# Patient Record
Sex: Female | Born: 1960 | Race: White | Hispanic: No | Marital: Married | Smoking: Never smoker
Health system: Southern US, Community
[De-identification: ages and names within clinical notes are randomized; demographics above are authoritative.]

## PROBLEM LIST (undated history)

## (undated) DIAGNOSIS — C4491 Basal cell carcinoma of skin, unspecified: Secondary | ICD-10-CM

## (undated) DIAGNOSIS — S52501A Unspecified fracture of the lower end of right radius, initial encounter for closed fracture: Secondary | ICD-10-CM

## (undated) DIAGNOSIS — K219 Gastro-esophageal reflux disease without esophagitis: Secondary | ICD-10-CM

## (undated) HISTORY — DX: Basal cell carcinoma of skin, unspecified: C44.91

---

## 1999-06-22 ENCOUNTER — Other Ambulatory Visit: Admission: RE | Admit: 1999-06-22 | Discharge: 1999-06-22 | Payer: Self-pay | Admitting: *Deleted

## 2000-10-24 ENCOUNTER — Other Ambulatory Visit: Admission: RE | Admit: 2000-10-24 | Discharge: 2000-10-24 | Payer: Self-pay | Admitting: Gynecology

## 2000-11-08 ENCOUNTER — Encounter (INDEPENDENT_AMBULATORY_CARE_PROVIDER_SITE_OTHER): Payer: Self-pay

## 2000-11-08 ENCOUNTER — Other Ambulatory Visit: Admission: RE | Admit: 2000-11-08 | Discharge: 2000-11-08 | Payer: Self-pay | Admitting: Gynecology

## 2001-11-09 ENCOUNTER — Other Ambulatory Visit: Admission: RE | Admit: 2001-11-09 | Discharge: 2001-11-09 | Payer: Self-pay | Admitting: Gynecology

## 2003-03-12 ENCOUNTER — Other Ambulatory Visit: Admission: RE | Admit: 2003-03-12 | Discharge: 2003-03-12 | Payer: Self-pay | Admitting: Obstetrics and Gynecology

## 2004-01-07 ENCOUNTER — Emergency Department (HOSPITAL_COMMUNITY): Admission: AD | Admit: 2004-01-07 | Discharge: 2004-01-07 | Payer: Self-pay | Admitting: Family Medicine

## 2004-02-15 ENCOUNTER — Emergency Department (HOSPITAL_COMMUNITY): Admission: AD | Admit: 2004-02-15 | Discharge: 2004-02-15 | Payer: Self-pay | Admitting: Family Medicine

## 2004-04-13 ENCOUNTER — Other Ambulatory Visit: Admission: RE | Admit: 2004-04-13 | Discharge: 2004-04-13 | Payer: Self-pay | Admitting: Obstetrics and Gynecology

## 2005-08-23 ENCOUNTER — Other Ambulatory Visit: Admission: RE | Admit: 2005-08-23 | Discharge: 2005-08-23 | Payer: Self-pay | Admitting: Obstetrics and Gynecology

## 2006-09-20 ENCOUNTER — Emergency Department (HOSPITAL_COMMUNITY): Admission: EM | Admit: 2006-09-20 | Discharge: 2006-09-20 | Payer: Self-pay | Admitting: Family Medicine

## 2006-11-17 ENCOUNTER — Other Ambulatory Visit: Admission: RE | Admit: 2006-11-17 | Discharge: 2006-11-17 | Payer: Self-pay | Admitting: Obstetrics and Gynecology

## 2008-06-01 ENCOUNTER — Emergency Department (HOSPITAL_COMMUNITY): Admission: EM | Admit: 2008-06-01 | Discharge: 2008-06-01 | Payer: Self-pay | Admitting: Family Medicine

## 2008-06-28 ENCOUNTER — Emergency Department (HOSPITAL_COMMUNITY): Admission: EM | Admit: 2008-06-28 | Discharge: 2008-06-28 | Payer: Self-pay | Admitting: Emergency Medicine

## 2008-12-03 ENCOUNTER — Other Ambulatory Visit: Admission: RE | Admit: 2008-12-03 | Discharge: 2008-12-03 | Payer: Self-pay | Admitting: Obstetrics and Gynecology

## 2010-06-23 LAB — HM PAP SMEAR: HM Pap smear: NEGATIVE

## 2010-09-20 ENCOUNTER — Emergency Department (HOSPITAL_COMMUNITY): Admission: EM | Admit: 2010-09-20 | Discharge: 2010-09-20 | Payer: Self-pay | Admitting: Emergency Medicine

## 2012-01-03 ENCOUNTER — Encounter (HOSPITAL_COMMUNITY): Payer: Self-pay | Admitting: Emergency Medicine

## 2012-01-03 ENCOUNTER — Emergency Department (HOSPITAL_COMMUNITY)
Admission: EM | Admit: 2012-01-03 | Discharge: 2012-01-03 | Disposition: A | Discharge status: Home / Self Care | Payer: 59 | Source: Home / Self Care | Attending: Family Medicine | Admitting: Family Medicine

## 2012-01-03 DIAGNOSIS — J329 Chronic sinusitis, unspecified: Secondary | ICD-10-CM

## 2012-01-03 MED ORDER — FEXOFENADINE-PSEUDOEPHED ER 60-120 MG PO TB12: 1.0000 | ORAL_TABLET | Freq: Two times a day (BID) | ORAL | Status: AC

## 2012-01-03 MED ORDER — AMOXICILLIN 500 MG PO CAPS: 500.0000 mg | ORAL_CAPSULE | Freq: Three times a day (TID) | ORAL | Status: AC

## 2012-01-03 MED ORDER — PREDNISONE 20 MG PO TABS: ORAL_TABLET | ORAL | Status: AC

## 2012-01-03 MED ORDER — IBUPROFEN 600 MG PO TABS: 600.0000 mg | ORAL_TABLET | Freq: Three times a day (TID) | ORAL | Status: AC | PRN

## 2012-01-03 NOTE — ED Notes (Signed)
Pt. Stated, I think I have a sinus infection , pain in face with body aches a little fever and headache.  Onset one week

## 2012-01-04 NOTE — ED Provider Notes (Signed)
History     CSN: [id number]  Arrival date & time 01/03/12  1929   First MD Initiated Contact with Patient 01/03/12 1940      Chief Complaint  Patient presents with  . Facial Pain    (Consider location/radiation/quality/duration/timing/severity/associated sxs/prior treatment) HPI Comments: 51 y/o female no significant PMH here c/o sinus pain, nasal congestion, yellow rhinorrhea and body aches for 1 week. Subjective fever. Denies cough shortness of breath or chest pain. Had mild sore throat initially but now resolved. Sinus pain with headache worsening.    History reviewed. No pertinent past medical history.  No past surgical history on file.  No family history on file.  History  Substance Use Topics  . Smoking status: Former Games developer  . Smokeless tobacco: Not on file  . Alcohol Use: 0.6 oz/week    1 Glasses of wine per week     once a month    OB History    Grav Para Term Preterm Abortions TAB SAB Ect Mult Living                  Review of Systems  Constitutional: Positive for chills.  HENT: Positive for congestion, rhinorrhea and sinus pressure. Negative for ear pain, sore throat, trouble swallowing, neck pain and voice change.   Respiratory: Negative for cough, chest tightness, shortness of breath and wheezing.   Cardiovascular: Negative for chest pain and leg swelling.  Gastrointestinal: Positive for abdominal pain. Negative for nausea and vomiting.  Musculoskeletal: Positive for myalgias.  Skin: Negative for rash.  Neurological: Positive for headaches.    Allergies  Review of patient's allergies indicates no known allergies.  Home Medications   Current Outpatient Rx  Name Route Sig Dispense Refill  . AMOXICILLIN 500 MG PO CAPS Oral Take 1 capsule (500 mg total) by mouth 3 (three) times daily. 30 capsule 0  . FEXOFENADINE-PSEUDOEPHED ER 60-120 MG PO TB12 Oral Take 1 tablet by mouth every 12 (twelve) hours. 30 tablet 0  . IBUPROFEN 600 MG PO TABS Oral Take 1  tablet (600 mg total) by mouth every 8 (eight) hours as needed for pain or fever. 20 tablet 0  . PREDNISONE 20 MG PO TABS  2 tabs daily for 5 days 10 tablet no    BP 119/81  Pulse 74  Temp(Src) 98.6 F (37 C) (Oral)  Resp 16  SpO2 99%  Physical Exam  Nursing note and vitals reviewed. Constitutional: She is oriented to person, place, and time. She appears well-developed and well-nourished. No distress.  HENT:  Head: Normocephalic and atraumatic.  Right Ear: External ear normal.  Left Ear: External ear normal.  Mouth/Throat: Oropharynx is clear and moist. No oropharyngeal exudate.       Nasal congestion swelling of nasal turbinates, yellow thin rhinorrhea. TTP over maxillary sinuses more left than right. No gum swelling or drainage inside mouth.   Eyes: Conjunctivae and EOM are normal. Pupils are equal, round, and reactive to light. Right eye exhibits no discharge. Left eye exhibits no discharge.  Neck: Normal range of motion. Neck supple. No JVD present.  Cardiovascular: Normal rate, regular rhythm and normal heart sounds.   Pulmonary/Chest: Effort normal and breath sounds normal. No respiratory distress. She has no wheezes. She has no rales. She exhibits no tenderness.  Lymphadenopathy:    She has no cervical adenopathy.  Neurological: She is alert and oriented to person, place, and time.    ED Course  Procedures (including critical care time)  Labs Reviewed -  No data to display No results found.   1. Sinusitis       MDM  Treated with prednisone, antihistamine/decongestant, amoxicillin, Advil, nasal saline spray.         Sharin Grave, MD 01/04/12 1624

## 2012-12-14 LAB — HM MAMMOGRAPHY: HM Mammogram: NEGATIVE

## 2013-04-02 ENCOUNTER — Encounter: Payer: Self-pay | Admitting: *Deleted

## 2013-04-05 ENCOUNTER — Ambulatory Visit: Payer: Self-pay | Admitting: Obstetrics and Gynecology

## 2013-11-26 ENCOUNTER — Emergency Department (HOSPITAL_COMMUNITY)
Admission: EM | Admit: 2013-11-26 | Discharge: 2013-11-26 | Disposition: A | Discharge status: Home / Self Care | Payer: 59 | Source: Home / Self Care | Attending: Family Medicine | Admitting: Family Medicine

## 2013-11-26 ENCOUNTER — Encounter (HOSPITAL_COMMUNITY): Payer: Self-pay | Admitting: Emergency Medicine

## 2013-11-26 DIAGNOSIS — J329 Chronic sinusitis, unspecified: Secondary | ICD-10-CM

## 2013-11-26 MED ORDER — IPRATROPIUM BROMIDE 0.06 % NA SOLN: 2.0000 | Freq: Four times a day (QID) | NASAL | Status: DC

## 2013-11-26 MED ORDER — AMOXICILLIN-POT CLAVULANATE 875-125 MG PO TABS: 1.0000 | ORAL_TABLET | Freq: Two times a day (BID) | ORAL | Status: DC

## 2013-11-26 MED ORDER — PREDNISONE 20 MG PO TABS: 20.0000 mg | ORAL_TABLET | Freq: Every day | ORAL | Status: DC

## 2013-11-26 NOTE — ED Notes (Signed)
C/o facial/sinus pressure and yellow secretions

## 2013-11-26 NOTE — ED Provider Notes (Signed)
Caitlin Herman is a 52 y.o. female who presents to Urgent Care today for left maxillary sinus pain and pressure. This has been present for 1-2 days. She has had preceding nasal congestion and post nasal drip. She notes a mild subjective fever. No nausea, diarrhea or vomiting. No trouble breathing. She has tried some OTC cough and cold medications which helped a bit. No chest pain.    Past Medical History  Diagnosis Date  . Basal cell carcinoma     chest   History  Substance Use Topics  . Smoking status: Former Smoker    Start date: 12/05/1997  . Smokeless tobacco: Not on file  . Alcohol Use: 0.6 oz/week    1 Glasses of wine per week     Comment: once a month   ROS as above Medications reviewed. No current facility-administered medications for this encounter.   Current Outpatient Prescriptions  Medication Sig Dispense Refill  . amoxicillin-clavulanate (AUGMENTIN) 875-125 MG per tablet Take 1 tablet by mouth every 12 (twelve) hours.  14 tablet  0  . Ascorbic Acid (VITAMIN C PO) Take by mouth daily.      Marland Kitchen aspirin-acetaminophen-caffeine (EXCEDRIN MIGRAINE) 250-250-65 MG per tablet Take 1 tablet by mouth every 6 (six) hours as needed for pain.      . Calcium-Vitamin D-Vitamin K 500-500-40 MG-UNT-MCG CHEW Chew by mouth.      Marland Kitchen ipratropium (ATROVENT) 0.06 % nasal spray Place 2 sprays into both nostrils 4 (four) times daily.  15 mL  1  . Multiple Vitamin (MULTI-VITAMIN PO) Take by mouth daily.      . predniSONE (DELTASONE) 20 MG tablet Take 1 tablet (20 mg total) by mouth daily.  40 tablet  0    Exam:  BP 120/76  Pulse 62  Temp(Src) 97.5 F (36.4 C) (Oral)  Resp 16  Gen: Well NAD HEENT: EOMI,  MMM dictating membranes are normal appearing bilaterally. Posterior pharynx with cobblestoning. The turbinates are inflamed bilaterally without any significant discharge. Tender palpation left maxillary sinus. Lungs: Normal work of breathing. CTABL Heart: RRR no MRG Exts: Brisk capillary  refill, warm and well perfused.   No results found for this or any previous visit (from the past 24 hour(s)). No results found.  Assessment and Plan: 52 y.o. female with sinusitis. Possibly bacterial. Plan to treat with low-dose and duration prednisone, Atrovent nasal spray, and one week of Augmentin.   Discussed warning signs or symptoms. Please see discharge instructions. Patient expresses understanding.     Rodolph Bong, MD 11/26/13 [phone]

## 2013-12-28 ENCOUNTER — Emergency Department (HOSPITAL_COMMUNITY)
Admission: EM | Admit: 2013-12-28 | Discharge: 2013-12-28 | Disposition: A | Discharge status: Home / Self Care | Payer: 59 | Source: Home / Self Care | Attending: Family Medicine | Admitting: Family Medicine

## 2013-12-28 ENCOUNTER — Emergency Department (INDEPENDENT_AMBULATORY_CARE_PROVIDER_SITE_OTHER): Payer: 59

## 2013-12-28 ENCOUNTER — Encounter (HOSPITAL_COMMUNITY): Payer: Self-pay | Admitting: Emergency Medicine

## 2013-12-28 DIAGNOSIS — J40 Bronchitis, not specified as acute or chronic: Secondary | ICD-10-CM

## 2013-12-28 MED ORDER — ALBUTEROL SULFATE HFA 108 (90 BASE) MCG/ACT IN AERS: 1.0000 | INHALATION_SPRAY | Freq: Four times a day (QID) | RESPIRATORY_TRACT | Status: AC | PRN

## 2013-12-28 MED ORDER — FLUTICASONE PROPIONATE 50 MCG/ACT NA SUSP: 2.0000 | Freq: Two times a day (BID) | NASAL | Status: DC

## 2013-12-28 MED ORDER — IPRATROPIUM BROMIDE 0.02 % IN SOLN
RESPIRATORY_TRACT | Status: AC
  Filled 2013-12-28: qty 2.5

## 2013-12-28 MED ORDER — SODIUM CHLORIDE 0.9 % IN NEBU
INHALATION_SOLUTION | RESPIRATORY_TRACT | Status: AC
  Filled 2013-12-28: qty 3

## 2013-12-28 MED ORDER — ALBUTEROL SULFATE (2.5 MG/3ML) 0.083% IN NEBU
INHALATION_SOLUTION | RESPIRATORY_TRACT | Status: AC
  Filled 2013-12-28: qty 3

## 2013-12-28 MED ORDER — IPRATROPIUM-ALBUTEROL 0.5-2.5 (3) MG/3ML IN SOLN
3.0000 mL | Freq: Once | RESPIRATORY_TRACT | Status: AC
  Administered 2013-12-28: 3 mL via RESPIRATORY_TRACT

## 2013-12-28 MED ORDER — AZITHROMYCIN 250 MG PO TABS: ORAL_TABLET | ORAL | Status: AC

## 2013-12-28 NOTE — ED Provider Notes (Signed)
CSN: [id number]     Arrival date & time 12/28/13  1000 History   First MD Initiated Contact with Patient 12/28/13 1033     Chief Complaint  Patient presents with  . Cough   (Consider location/radiation/quality/duration/timing/severity/associated sxs/prior Treatment) HPI Comments: 53 year old female presents complaining of cough and congestion. These symptoms have been present for about one week, getting slowly and progressively worse. Initially, she just had the nasal congestion. The cough started 4 days ago and has been persistent, worse at night. She also admits to shortness of breath on exertion as well as a mild sore throat. Over-the-counter medications are not helping. Denies fever, chest pain, pleuritic chest pain, NVD  Patient is a 53 y.o. female presenting with cough.  Cough Associated symptoms: shortness of breath and sore throat   Associated symptoms: no chest pain, no chills, no fever, no myalgias and no rash     Past Medical History  Diagnosis Date  . Basal cell carcinoma     chest   History reviewed. No pertinent past surgical history. No family history on file. History  Substance Use Topics  . Smoking status: Former Smoker    Start date: 12/05/1997  . Smokeless tobacco: Not on file  . Alcohol Use: 0.0 oz/week     Comment: occasional   OB History   Grav Para Term Preterm Abortions TAB SAB Ect Mult Living   3 3        3      Review of Systems  Constitutional: Negative for fever and chills.  HENT: Positive for congestion and sore throat.   Eyes: Negative for visual disturbance.  Respiratory: Positive for cough and shortness of breath.   Cardiovascular: Negative for chest pain, palpitations and leg swelling.  Gastrointestinal: Negative for nausea, vomiting and abdominal pain.  Endocrine: Negative for polydipsia and polyuria.  Genitourinary: Negative for dysuria, urgency and frequency.  Musculoskeletal: Negative for arthralgias and myalgias.  Skin: Negative for  rash.  Neurological: Negative for dizziness, weakness and light-headedness.    Allergies  Review of patient's allergies indicates no known allergies.  Home Medications   Current Outpatient Rx  Name  Route  Sig  Dispense  Refill  . albuterol (PROVENTIL HFA;VENTOLIN HFA) 108 (90 BASE) MCG/ACT inhaler   Inhalation   Inhale 1-2 puffs into the lungs every 6 (six) hours as needed for wheezing or shortness of breath.   1 Inhaler   0   . amoxicillin-clavulanate (AUGMENTIN) 875-125 MG per tablet   Oral   Take 1 tablet by mouth every 12 (twelve) hours.   14 tablet   0   . Ascorbic Acid (VITAMIN C PO)   Oral   Take by mouth daily.         Marland Kitchen aspirin-acetaminophen-caffeine (EXCEDRIN MIGRAINE) 250-250-65 MG per tablet   Oral   Take 1 tablet by mouth every 6 (six) hours as needed for pain.         Marland Kitchen azithromycin (ZITHROMAX Z-PAK) 250 MG tablet      Use as directed   6 each   0   . Calcium-Vitamin D-Vitamin K 749-449-67 MG-UNT-MCG CHEW   Oral   Chew by mouth.         . fluticasone (FLONASE) 50 MCG/ACT nasal spray   Each Nare   Place 2 sprays into both nostrils 2 (two) times daily.   1 g   2   . ipratropium (ATROVENT) 0.06 % nasal spray   Each Nare   Place 2 sprays  into both nostrils 4 (four) times daily.   15 mL   1   . Multiple Vitamin (MULTI-VITAMIN PO)   Oral   Take by mouth daily.         . predniSONE (DELTASONE) 20 MG tablet   Oral   Take 1 tablet (20 mg total) by mouth daily.   40 tablet   0    BP 130/68  Pulse 71  Temp(Src) 98.2 F (36.8 C) (Oral)  Resp 18  SpO2 98% Physical Exam  Nursing note and vitals reviewed. Constitutional: She is oriented to person, place, and time. Vital signs are normal. She appears well-developed and well-nourished. No distress.  HENT:  Head: Normocephalic and atraumatic.  Right Ear: External ear normal.  Left Ear: External ear normal.  Nose: Nose normal.  Mouth/Throat: Oropharynx is clear and moist. No  oropharyngeal exudate.  Eyes: Conjunctivae are normal. Right eye exhibits no discharge. Left eye exhibits no discharge.  Neck: Normal range of motion. Neck supple.  Cardiovascular: Normal rate, regular rhythm and normal heart sounds.  Exam reveals no gallop and no friction rub.   No murmur heard. Pulmonary/Chest: Effort normal. No respiratory distress. She has wheezes (scattered). She has rhonchi in the right upper field and the right middle field.  Late inspiratory squeak in bilateral lower lung fields   Lymphadenopathy:    She has no cervical adenopathy.  Neurological: She is alert and oriented to person, place, and time. She has normal strength. Coordination normal.  Skin: Skin is warm and dry. No rash noted. She is not diaphoretic.  Psychiatric: She has a normal mood and affect. Judgment normal.    ED Course  Procedures (including critical care time) Labs Review Labs Reviewed - No data to display Imaging Review Dg Chest 2 View  12/28/2013   CLINICAL DATA:  Cough, patient denies fever  EXAM: CHEST  2 VIEW  COMPARISON:  Prior chest x-ray 06/28/2008  FINDINGS: The lungs are clear and negative for focal airspace consolidation, pulmonary edema or suspicious pulmonary nodule. No pleural effusion or pneumothorax. Cardiac and mediastinal contours are within normal limits. No acute fracture or lytic or blastic osseous lesions. The visualized upper abdominal bowel gas pattern is unremarkable.  IMPRESSION: No active cardiopulmonary disease.   Electronically Signed   By: Jacqulynn Cadet M.D.   On: 12/28/2013 12:12      MDM   1. Bronchitis    Significant improvement symptomatically and to auscultation after breathing treatment. Chest x-ray is clear. Treating for bronchitis, post dated prescription for azithromycin if no improvement after 4-5 more days.  Meds ordered this encounter  Medications  . ipratropium-albuterol (DUONEB) 0.5-2.5 (3) MG/3ML nebulizer solution 3 mL    Sig:   .  azithromycin (ZITHROMAX Z-PAK) 250 MG tablet    Sig: Use as directed    Dispense:  6 each    Refill:  0    Order Specific Question:  Supervising Provider    Answer:  Billy Fischer [phone]  . albuterol (PROVENTIL HFA;VENTOLIN HFA) 108 (90 BASE) MCG/ACT inhaler    Sig: Inhale 1-2 puffs into the lungs every 6 (six) hours as needed for wheezing or shortness of breath.    Dispense:  1 Inhaler    Refill:  0    Order Specific Question:  Supervising Provider    Answer:  Ihor Gully D [id number]  . fluticasone (FLONASE) 50 MCG/ACT nasal spray    Sig: Place 2 sprays into both nostrils 2 (two) times daily.  Dispense:  1 g    Refill:  2    Order Specific Question:  Supervising Provider    Answer:  Ihor Gully D [place], PA-C 12/28/13 1218

## 2013-12-28 NOTE — ED Provider Notes (Signed)
Medical screening examination/treatment/procedure(s) were performed by a resident physician or non-physician practitioner and as the supervising physician I was immediately available for consultation/collaboration.  Lynne Leader, MD    Gregor Hams, MD 12/28/13 [phone]

## 2013-12-28 NOTE — ED Notes (Signed)
C/O congestion, cough x 1 wk; congestion not improving despite using Sudafed, Dayquil, Nyquil.  Has had severe HA with sinus pressure.  C/O severe coughing fits with occasional sputum.  Unk if fevers.

## 2013-12-28 NOTE — Discharge Instructions (Signed)
Bronchitis  Bronchitis is inflammation of the airways that extend from the windpipe into the lungs (bronchi). The inflammation often causes mucus to develop, which leads to a cough. If the inflammation becomes severe, it may cause shortness of breath.  CAUSES   Bronchitis may be caused by:   · Viral infections.    · Bacteria.    · Cigarette smoke.    · Allergens, pollutants, and other irritants.    SIGNS AND SYMPTOMS   The most common symptom of bronchitis is a frequent cough that produces mucus. Other symptoms include:  · Fever.    · Body aches.    · Chest congestion.    · Chills.    · Shortness of breath.    · Sore throat.    DIAGNOSIS   Bronchitis is usually diagnosed through a medical history and physical exam. Tests, such as chest X-rays, are sometimes done to rule out other conditions.   TREATMENT   You may need to avoid contact with whatever caused the problem (smoking, for example). Medicines are sometimes needed. These may include:  · Antibiotics. These may be prescribed if the condition is caused by bacteria.  · Cough suppressants. These may be prescribed for relief of cough symptoms.    · Inhaled medicines. These may be prescribed to help open your airways and make it easier for you to breathe.    · Steroid medicines. These may be prescribed for those with recurrent (chronic) bronchitis.  HOME CARE INSTRUCTIONS  · Get plenty of rest.    · Drink enough fluids to keep your urine clear or pale yellow (unless you have a medical condition that requires fluid restriction). Increasing fluids may help thin your secretions and will prevent dehydration.    · Only take over-the-counter or prescription medicines as directed by your health care provider.  · Only take antibiotics as directed. Make sure you finish them even if you start to feel better.  · Avoid secondhand smoke, irritating chemicals, and strong fumes. These will make bronchitis worse. If you are a smoker, quit smoking. Consider using nicotine gum or  skin patches to help control withdrawal symptoms. Quitting smoking will help your lungs heal faster.    · Put a cool-mist humidifier in your bedroom at night to moisten the air. This may help loosen mucus. Change the water in the humidifier daily. You can also run the hot water in your shower and sit in the bathroom with the door closed for 5 10 minutes.    · Follow up with your health care provider as directed.    · Wash your hands frequently to avoid catching bronchitis again or spreading an infection to others.    SEEK MEDICAL CARE IF:  Your symptoms do not improve after 1 week of treatment.   SEEK IMMEDIATE MEDICAL CARE IF:  · Your fever increases.  · You have chills.    · You have chest pain.    · You have worsening shortness of breath.    · You have bloody sputum.  · You faint.    · You have lightheadedness.  · You have a severe headache.    · You vomit repeatedly.  MAKE SURE YOU:   · Understand these instructions.  · Will watch your condition.  · Will get help right away if you are not doing well or get worse.  Document Released: 11/21/2005 Document Revised: 09/11/2013 Document Reviewed: 07/16/2013  ExitCare® Patient Information ©2014 [employer].    Antibiotic Nonuse   Your caregiver felt that the infection or problem was not one that would be helped with an antibiotic.  Infections   may be caused by viruses or bacteria. Only a caregiver can tell which one of these is the likely cause of an illness. A cold is the most common cause of infection in both adults and children. A cold is a virus. Antibiotic treatment will have no effect on a viral infection. Viruses can lead to many lost days of work caring for sick children and many missed days of school. Children may catch as many as 10 "colds" or "flus" per year during which they can be tearful, cranky, and uncomfortable. The goal of treating a virus is aimed at keeping the ill person comfortable.  Antibiotics are medications used to help the body fight  bacterial infections. There are relatively few types of bacteria that cause infections but there are hundreds of viruses. While both viruses and bacteria cause infection they are very different types of germs. A viral infection will typically go away by itself within 7 to 10 days. Bacterial infections may spread or get worse without antibiotic treatment.  Examples of bacterial infections are:  · Sore throats (like strep throat or tonsillitis).  · Infection in the lung (pneumonia).  · Ear and skin infections.  Examples of viral infections are:  · Colds or flus.  · Most coughs and bronchitis.  · Sore throats not caused by Strep.  · Runny noses.  It is often best not to take an antibiotic when a viral infection is the cause of the problem. Antibiotics can kill off the helpful bacteria that we have inside our body and allow harmful bacteria to start growing. Antibiotics can cause side effects such as allergies, nausea, and diarrhea without helping to improve the symptoms of the viral infection. Additionally, repeated uses of antibiotics can cause bacteria inside of our body to become resistant. That resistance can be passed onto harmful bacterial. The next time you have an infection it may be harder to treat if antibiotics are used when they are not needed. Not treating with antibiotics allows our own immune system to develop and take care of infections more efficiently. Also, antibiotics will work better for us when they are prescribed for bacterial infections.  Treatments for a child that is ill may include:  · Give extra fluids throughout the day to stay hydrated.  · Get plenty of rest.  · Only give your child over-the-counter or prescription medicines for pain, discomfort, or fever as directed by your caregiver.  · The use of a cool mist humidifier may help stuffy noses.  · Cold medications if suggested by your caregiver.  Your caregiver may decide to start you on an antibiotic if:  · The problem you were seen for  today continues for a longer length of time than expected.  · You develop a secondary bacterial infection.  SEEK MEDICAL CARE IF:  · Fever lasts longer than 5 days.  · Symptoms continue to get worse after 5 to 7 days or become severe.  · Difficulty in breathing develops.  · Signs of dehydration develop (poor drinking, rare urinating, dark colored urine).  · Changes in behavior or worsening tiredness (listlessness or lethargy).  Document Released: 01/30/2002 Document Revised: 02/13/2012 Document Reviewed: 07/29/2009  ExitCare® Patient Information ©2014 [employer].

## 2013-12-28 NOTE — ED Notes (Signed)
Patient is resting comfortably and made aware of delays

## 2014-01-03 ENCOUNTER — Emergency Department (HOSPITAL_COMMUNITY)
Admission: EM | Admit: 2014-01-03 | Discharge: 2014-01-03 | Disposition: A | Discharge status: Home / Self Care | Payer: 59 | Source: Home / Self Care | Attending: Family Medicine | Admitting: Family Medicine

## 2014-01-03 ENCOUNTER — Encounter (HOSPITAL_COMMUNITY): Payer: Self-pay | Admitting: Emergency Medicine

## 2014-01-03 DIAGNOSIS — J069 Acute upper respiratory infection, unspecified: Secondary | ICD-10-CM

## 2014-01-03 MED ORDER — IPRATROPIUM BROMIDE 0.06 % NA SOLN: 2.0000 | Freq: Four times a day (QID) | NASAL | Status: DC

## 2014-01-03 MED ORDER — HYDROCOD POLST-CHLORPHEN POLST 10-8 MG/5ML PO LQCR: 5.0000 mL | Freq: Two times a day (BID) | ORAL | Status: DC | PRN

## 2014-01-03 NOTE — Discharge Instructions (Signed)
Drink plenty of fluids as discussed, use medicine as prescribed, and mucinex or delsym for cough. Return or see your doctor if further problems °

## 2014-01-03 NOTE — ED Provider Notes (Signed)
CSN: [id number]     Arrival date & time 01/03/14  1915 History   First MD Initiated Contact with Patient 01/03/14 2005     Chief Complaint  Patient presents with  . Cough   (Consider location/radiation/quality/duration/timing/severity/associated sxs/prior Treatment) Patient is a 53 y.o. female presenting with cough. The history is provided by the patient.  Cough Cough characteristics:  Non-productive, dry and hoarse Severity:  Moderate Onset quality:  Gradual Duration:  13 days Chronicity:  New Smoker: no   Context comment:  Seen 1/24 given z-pak , sx continue. Associated symptoms: rhinorrhea   Associated symptoms: no chest pain and no fever     Past Medical History  Diagnosis Date  . Basal cell carcinoma     chest   History reviewed. No pertinent past surgical history. No family history on file. History  Substance Use Topics  . Smoking status: Former Smoker    Start date: 12/05/1997  . Smokeless tobacco: Not on file  . Alcohol Use: 0.0 oz/week     Comment: occasional   OB History   Grav Para Term Preterm Abortions TAB SAB Ect Mult Living   3 3        3      Review of Systems  Constitutional: Negative.  Negative for fever.  HENT: Positive for congestion, postnasal drip and rhinorrhea.   Respiratory: Positive for cough.   Cardiovascular: Negative for chest pain.    Allergies  Review of patient's allergies indicates no known allergies.  Home Medications   Current Outpatient Rx  Name  Route  Sig  Dispense  Refill  . albuterol (PROVENTIL HFA;VENTOLIN HFA) 108 (90 BASE) MCG/ACT inhaler   Inhalation   Inhale 1-2 puffs into the lungs every 6 (six) hours as needed for wheezing or shortness of breath.   1 Inhaler   0   . amoxicillin-clavulanate (AUGMENTIN) 875-125 MG per tablet   Oral   Take 1 tablet by mouth every 12 (twelve) hours.   14 tablet   0   . Ascorbic Acid (VITAMIN C PO)   Oral   Take by mouth daily.         Marland Kitchen aspirin-acetaminophen-caffeine  (EXCEDRIN MIGRAINE) 250-250-65 MG per tablet   Oral   Take 1 tablet by mouth every 6 (six) hours as needed for pain.         Marland Kitchen azithromycin (ZITHROMAX Z-PAK) 250 MG tablet      Use as directed   6 each   0   . Calcium-Vitamin D-Vitamin K 073-710-62 MG-UNT-MCG CHEW   Oral   Chew by mouth.         . chlorpheniramine-HYDROcodone (TUSSIONEX PENNKINETIC ER) 10-8 MG/5ML LQCR   Oral   Take 5 mLs by mouth every 12 (twelve) hours as needed for cough.   115 mL   1   . fluticasone (FLONASE) 50 MCG/ACT nasal spray   Each Nare   Place 2 sprays into both nostrils 2 (two) times daily.   1 g   2   . ipratropium (ATROVENT) 0.06 % nasal spray   Each Nare   Place 2 sprays into both nostrils 4 (four) times daily.   15 mL   1   . ipratropium (ATROVENT) 0.06 % nasal spray   Each Nare   Place 2 sprays into both nostrils 4 (four) times daily.   15 mL   2   . Multiple Vitamin (MULTI-VITAMIN PO)   Oral   Take by mouth daily.         Marland Kitchen  predniSONE (DELTASONE) 20 MG tablet   Oral   Take 1 tablet (20 mg total) by mouth daily.   40 tablet   0    BP 123/70  Pulse 82  Temp(Src) 97.9 F (36.6 C) (Oral)  Resp 16  SpO2 100% Physical Exam  Nursing note and vitals reviewed. Constitutional: She is oriented to person, place, and time. She appears well-developed and well-nourished. No distress.  HENT:  Head: Normocephalic.  Right Ear: External ear normal.  Left Ear: External ear normal.  Mouth/Throat: Oropharynx is clear and moist.  Eyes: Conjunctivae are normal. Pupils are equal, round, and reactive to light.  Neck: Normal range of motion. Neck supple.  Cardiovascular: Normal rate, regular rhythm, normal heart sounds and intact distal pulses.   Pulmonary/Chest: Effort normal and breath sounds normal.  Lymphadenopathy:    She has no cervical adenopathy.  Neurological: She is alert and oriented to person, place, and time.  Skin: Skin is warm and dry.    ED Course  Procedures  (including critical care time) Labs Review Labs Reviewed - No data to display Imaging Review No results found.    MDM      Billy Fischer, MD 01/03/14 2052

## 2014-01-03 NOTE — ED Notes (Signed)
Patient reports symptoms started 1/17.  Reports cough, nagging cough, chest and nasal congestion, feels like she is choking on phlegm.  Patient is being treated with zpak by [facility].  Seen 1/24 for the same at [facility].  Patient reports she is finishing zpak and not feeling any better.

## 2014-08-13 ENCOUNTER — Other Ambulatory Visit: Payer: Self-pay | Admitting: Family Medicine

## 2014-08-13 ENCOUNTER — Other Ambulatory Visit (HOSPITAL_COMMUNITY)
Admission: RE | Admit: 2014-08-13 | Discharge: 2014-08-13 | Disposition: A | Discharge status: Home / Self Care | Payer: 59 | Source: Ambulatory Visit | Attending: Family Medicine | Admitting: Family Medicine

## 2014-08-13 DIAGNOSIS — Z01419 Encounter for gynecological examination (general) (routine) without abnormal findings: Secondary | ICD-10-CM | POA: Insufficient documentation

## 2014-08-13 DIAGNOSIS — Z1151 Encounter for screening for human papillomavirus (HPV): Secondary | ICD-10-CM | POA: Insufficient documentation

## 2014-08-15 LAB — CYTOLOGY - PAP

## 2014-10-06 ENCOUNTER — Encounter (HOSPITAL_COMMUNITY): Payer: Self-pay | Admitting: Emergency Medicine

## 2015-06-11 IMAGING — CR DG CHEST 2V
2 series · 2 of 2 positions shown · non-contrast
Comparison: Prior chest x-ray 06/28/2008

CLINICAL DATA: Cough, patient denies fever

EXAM:
CHEST  2 VIEW

[view not recorded (1 of 2)]
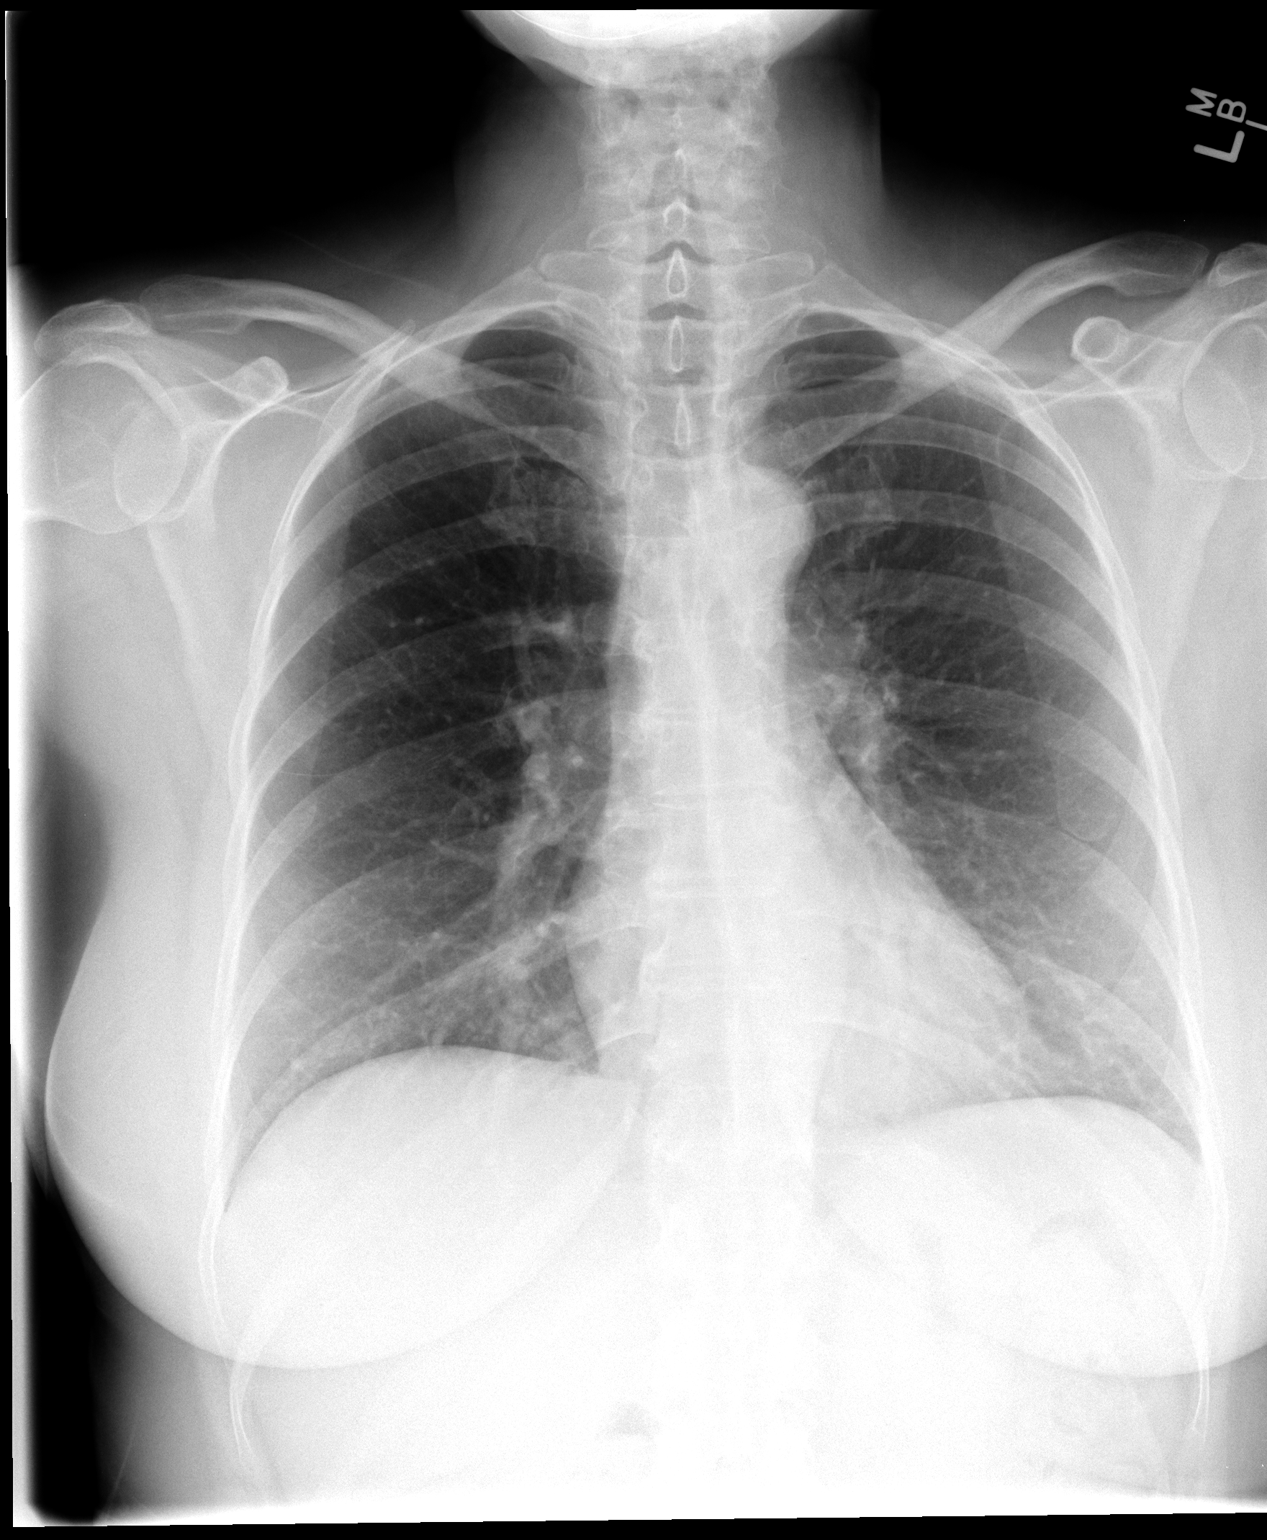

[view not recorded (2 of 2)]
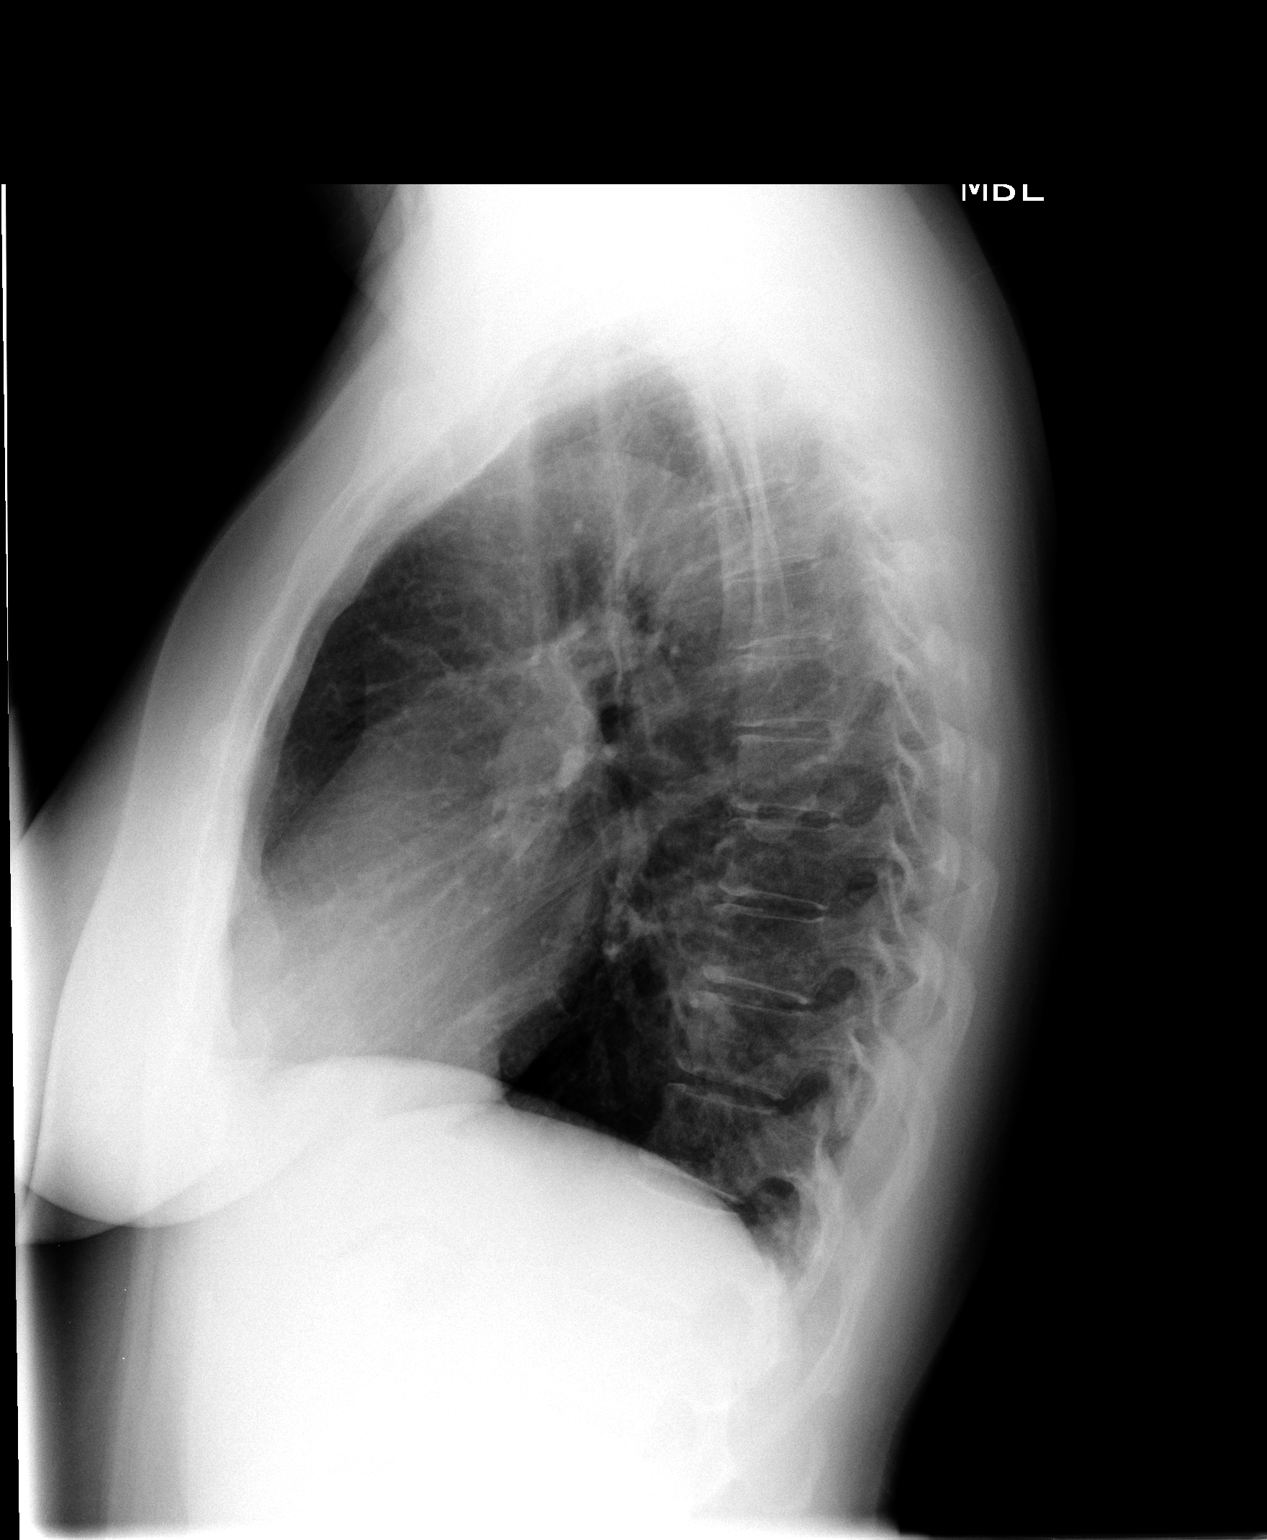

[2 of 2 positions shown; findings below may reference images not displayed]

FINDINGS: The lungs are clear and negative for focal airspace consolidation,
pulmonary edema or suspicious pulmonary nodule. No pleural effusion
or pneumothorax. Cardiac and mediastinal contours are within normal
limits. No acute fracture or lytic or blastic osseous lesions. The
visualized upper abdominal bowel gas pattern is unremarkable.
IMPRESSION: No active cardiopulmonary disease.

## 2021-07-13 ENCOUNTER — Other Ambulatory Visit: Payer: Self-pay | Admitting: Orthopedic Surgery

## 2021-07-13 ENCOUNTER — Other Ambulatory Visit: Payer: Self-pay

## 2021-07-13 ENCOUNTER — Encounter (HOSPITAL_BASED_OUTPATIENT_CLINIC_OR_DEPARTMENT_OTHER): Payer: Self-pay | Admitting: Orthopedic Surgery

## 2021-07-15 ENCOUNTER — Other Ambulatory Visit: Payer: Self-pay

## 2021-07-15 ENCOUNTER — Ambulatory Visit (HOSPITAL_BASED_OUTPATIENT_CLINIC_OR_DEPARTMENT_OTHER): Payer: No Typology Code available for payment source | Admitting: Anesthesiology

## 2021-07-15 ENCOUNTER — Encounter (HOSPITAL_BASED_OUTPATIENT_CLINIC_OR_DEPARTMENT_OTHER): Payer: Self-pay | Admitting: Orthopedic Surgery

## 2021-07-15 ENCOUNTER — Ambulatory Visit (HOSPITAL_BASED_OUTPATIENT_CLINIC_OR_DEPARTMENT_OTHER)
Admission: RE | Admit: 2021-07-15 | Discharge: 2021-07-15 | Disposition: A | Discharge status: Home / Self Care | Payer: No Typology Code available for payment source | Attending: Orthopedic Surgery | Admitting: Orthopedic Surgery

## 2021-07-15 ENCOUNTER — Encounter (HOSPITAL_BASED_OUTPATIENT_CLINIC_OR_DEPARTMENT_OTHER)
Admission: RE | Disposition: A | Discharge status: Home / Self Care | Payer: Self-pay | Source: Home / Self Care | Attending: Orthopedic Surgery

## 2021-07-15 DIAGNOSIS — Y9351 Activity, roller skating (inline) and skateboarding: Secondary | ICD-10-CM | POA: Insufficient documentation

## 2021-07-15 DIAGNOSIS — S52571A Other intraarticular fracture of lower end of right radius, initial encounter for closed fracture: Secondary | ICD-10-CM | POA: Diagnosis present

## 2021-07-15 DIAGNOSIS — Z85828 Personal history of other malignant neoplasm of skin: Secondary | ICD-10-CM | POA: Insufficient documentation

## 2021-07-15 DIAGNOSIS — W19XXXA Unspecified fall, initial encounter: Secondary | ICD-10-CM | POA: Insufficient documentation

## 2021-07-15 HISTORY — DX: Gastro-esophageal reflux disease without esophagitis: K21.9

## 2021-07-15 HISTORY — DX: Unspecified fracture of the lower end of right radius, initial encounter for closed fracture: S52.501A

## 2021-07-15 HISTORY — PX: OPEN REDUCTION INTERNAL FIXATION (ORIF) DISTAL RADIAL FRACTURE: SHX5989

## 2021-07-15 SURGERY — OPEN REDUCTION INTERNAL FIXATION (ORIF) DISTAL RADIUS FRACTURE
Anesthesia: Monitor Anesthesia Care | Site: Wrist | Laterality: Right

## 2021-07-15 MED ORDER — 0.9 % SODIUM CHLORIDE (POUR BTL) OPTIME
TOPICAL | Status: DC | PRN
  Administered 2021-07-15: 1000 mL

## 2021-07-15 MED ORDER — BUPIVACAINE HCL (PF) 0.25 % IJ SOLN
INTRAMUSCULAR | Status: AC
  Filled 2021-07-15: qty 30

## 2021-07-15 MED ORDER — CEFAZOLIN SODIUM-DEXTROSE 2-4 GM/100ML-% IV SOLN
2.0000 g | INTRAVENOUS | Status: AC
  Administered 2021-07-15: 2 g via INTRAVENOUS

## 2021-07-15 MED ORDER — OXYCODONE HCL 5 MG PO TABS: 5.0000 mg | ORAL_TABLET | Freq: Once | ORAL | Status: DC | PRN

## 2021-07-15 MED ORDER — OXYCODONE HCL 5 MG/5ML PO SOLN: 5.0000 mg | Freq: Once | ORAL | Status: DC | PRN

## 2021-07-15 MED ORDER — PROPOFOL 500 MG/50ML IV EMUL
INTRAVENOUS | Status: DC | PRN
  Administered 2021-07-15: 50 ug/kg/min via INTRAVENOUS

## 2021-07-15 MED ORDER — FENTANYL CITRATE (PF) 100 MCG/2ML IJ SOLN
INTRAMUSCULAR | Status: DC | PRN
  Administered 2021-07-15: 50 ug via INTRAVENOUS

## 2021-07-15 MED ORDER — ONDANSETRON HCL 4 MG/2ML IJ SOLN: 4.0000 mg | Freq: Once | INTRAMUSCULAR | Status: DC | PRN

## 2021-07-15 MED ORDER — AMISULPRIDE (ANTIEMETIC) 5 MG/2ML IV SOLN: 10.0000 mg | Freq: Once | INTRAVENOUS | Status: DC | PRN

## 2021-07-15 MED ORDER — MIDAZOLAM HCL 2 MG/2ML IJ SOLN
2.0000 mg | Freq: Once | INTRAMUSCULAR | Status: AC
  Administered 2021-07-15: 2 mg via INTRAVENOUS

## 2021-07-15 MED ORDER — FENTANYL CITRATE (PF) 100 MCG/2ML IJ SOLN: 25.0000 ug | INTRAMUSCULAR | Status: DC | PRN

## 2021-07-15 MED ORDER — ROPIVACAINE HCL 5 MG/ML IJ SOLN
INTRAMUSCULAR | Status: DC | PRN
  Administered 2021-07-15: 30 mL via PERINEURAL

## 2021-07-15 MED ORDER — ONDANSETRON HCL 4 MG/2ML IJ SOLN
INTRAMUSCULAR | Status: DC | PRN
  Administered 2021-07-15: 4 mg via INTRAVENOUS

## 2021-07-15 MED ORDER — LACTATED RINGERS IV SOLN: INTRAVENOUS | Status: DC

## 2021-07-15 MED ORDER — ACETAMINOPHEN 500 MG PO TABS
1000.0000 mg | ORAL_TABLET | Freq: Once | ORAL | Status: AC
  Administered 2021-07-15: 1000 mg via ORAL

## 2021-07-15 MED ORDER — FENTANYL CITRATE (PF) 100 MCG/2ML IJ SOLN
100.0000 ug | Freq: Once | INTRAMUSCULAR | Status: AC
Start: 2021-07-15 — End: 2021-07-15
  Administered 2021-07-15: 50 ug via INTRAVENOUS

## 2021-07-15 MED ORDER — HYDROCODONE-ACETAMINOPHEN 5-325 MG PO TABS: ORAL_TABLET | ORAL | 0 refills | Status: AC

## 2021-07-15 MED ORDER — MIDAZOLAM HCL 5 MG/5ML IJ SOLN
INTRAMUSCULAR | Status: DC | PRN
  Administered 2021-07-15: 1 mg via INTRAVENOUS

## 2021-07-15 SURGICAL SUPPLY — 56 items
BIT DRILL 2.0 LNG QUCK RELEASE (BIT) ×1 IMPLANT
BIT DRILL QC 2.8X5 (BIT) ×2 IMPLANT
BLADE SURG 15 STRL LF DISP TIS (BLADE) ×2 IMPLANT
BLADE SURG 15 STRL SS (BLADE) ×4
BNDG ELASTIC 3X5.8 VLCR STR LF (GAUZE/BANDAGES/DRESSINGS) ×2 IMPLANT
BNDG ESMARK 4X9 LF (GAUZE/BANDAGES/DRESSINGS) ×2 IMPLANT
BNDG GAUZE ELAST 4 BULKY (GAUZE/BANDAGES/DRESSINGS) ×2 IMPLANT
BNDG PLASTER X FAST 3X3 WHT LF (CAST SUPPLIES) ×20 IMPLANT
CHLORAPREP W/TINT 26 (MISCELLANEOUS) ×2 IMPLANT
CORD BIPOLAR FORCEPS 12FT (ELECTRODE) ×2 IMPLANT
COVER BACK TABLE 60X90IN (DRAPES) ×2 IMPLANT
COVER MAYO STAND STRL (DRAPES) ×2 IMPLANT
CUFF TOURN SGL QUICK 18X4 (TOURNIQUET CUFF) ×2 IMPLANT
CUFF TOURN SGL QUICK 24 (TOURNIQUET CUFF)
CUFF TRNQT CYL 24X4X16.5-23 (TOURNIQUET CUFF) IMPLANT
DRAPE EXTREMITY T 121X128X90 (DISPOSABLE) ×2 IMPLANT
DRAPE OEC MINIVIEW 54X84 (DRAPES) ×2 IMPLANT
DRAPE SURG 17X23 STRL (DRAPES) ×2 IMPLANT
DRILL 2.0 LNG QUICK RELEASE (BIT) ×2
GAUZE 4X4 16PLY ~~LOC~~+RFID DBL (SPONGE) ×2 IMPLANT
GAUZE SPONGE 4X4 12PLY STRL (GAUZE/BANDAGES/DRESSINGS) ×2 IMPLANT
GAUZE XEROFORM 1X8 LF (GAUZE/BANDAGES/DRESSINGS) ×2 IMPLANT
GLOVE SRG 8 PF TXTR STRL LF DI (GLOVE) ×1 IMPLANT
GLOVE SURG ENC MOIS LTX SZ7.5 (GLOVE) ×2 IMPLANT
GLOVE SURG ORTHO LTX SZ8 (GLOVE) ×2 IMPLANT
GLOVE SURG UNDER POLY LF SZ8 (GLOVE) ×2
GLOVE SURG UNDER POLY LF SZ8.5 (GLOVE) ×2 IMPLANT
GOWN STRL REUS W/ TWL LRG LVL3 (GOWN DISPOSABLE) ×4 IMPLANT
GOWN STRL REUS W/TWL LRG LVL3 (GOWN DISPOSABLE) ×8
GOWN STRL REUS W/TWL XL LVL3 (GOWN DISPOSABLE) ×4 IMPLANT
GUIDEWIRE ORTHO 0.054X6 (WIRE) ×6 IMPLANT
NEEDLE HYPO 25X1 1.5 SAFETY (NEEDLE) IMPLANT
NS IRRIG 1000ML POUR BTL (IV SOLUTION) ×2 IMPLANT
PACK BASIN DAY SURGERY FS (CUSTOM PROCEDURE TRAY) ×2 IMPLANT
PAD CAST 3X4 CTTN HI CHSV (CAST SUPPLIES) ×1 IMPLANT
PADDING CAST COTTON 3X4 STRL (CAST SUPPLIES) ×2
PLATE R NARROW PROC VDR (Plate) ×2 IMPLANT
SCREW ACTK 2 NL HEX 3.5.11 (Screw) ×2 IMPLANT
SCREW BN FT 16X2.3XLCK HEX CRT (Screw) ×1 IMPLANT
SCREW CORT FT 18X2.3XLCK HEX (Screw) ×1 IMPLANT
SCREW CORTICAL LOCKING 2.3X16M (Screw) ×2 IMPLANT
SCREW CORTICAL LOCKING 2.3X18M (Screw) ×8 IMPLANT
SCREW CORTICAL LOCKING 2.3X20M (Screw) ×2 IMPLANT
SCREW FX18X2.3XSMTH LCK NS CRT (Screw) ×3 IMPLANT
SCREW FX20X2.3XSMTH LCK NS CRT (Screw) ×1 IMPLANT
SCREW NON LOCK 3.5X10MM (Screw) ×2 IMPLANT
SCREW NONLOCK HEX 3.5X12 (Screw) ×2 IMPLANT
SLEEVE SCD COMPRESS KNEE MED (STOCKING) ×2 IMPLANT
SLING ARM FOAM STRAP LRG (SOFTGOODS) ×2 IMPLANT
STOCKINETTE 4X48 STRL (DRAPES) ×2 IMPLANT
SUT ETHILON 4 0 PS 2 18 (SUTURE) ×2 IMPLANT
SUT VICRYL 4-0 PS2 18IN ABS (SUTURE) ×2 IMPLANT
SYR BULB EAR ULCER 3OZ GRN STR (SYRINGE) ×2 IMPLANT
SYR CONTROL 10ML LL (SYRINGE) IMPLANT
TOWEL GREEN STERILE FF (TOWEL DISPOSABLE) ×4 IMPLANT
UNDERPAD 30X36 HEAVY ABSORB (UNDERPADS AND DIAPERS) ×2 IMPLANT

## 2021-07-15 NOTE — Op Note (Signed)
I assisted Surgeon(s) and Role:    * Leanora Cover, MD - Primary    Daryll Brod, MD - Assisting on the Procedure(s): OPEN REDUCTION INTERNAL FIXATION (ORIF) RIGHT DISTAL RADIAL FRACTURE on 07/15/2021.  I provided assistance on this case as follows: setup, approach, identification of the fracture ,tenotomy of the brachioradialis, manipulation, reduction, stabilization and fixation of the fracture with plate and screws, closure of the incision and application of the dressings and splint.  Electronically signed by: Daryll Brod, MD Date: 07/15/2021 Time: 4:18 PM

## 2021-07-15 NOTE — Transfer of Care (Signed)
Immediate Anesthesia Transfer of Care Note  Patient: [name]  Procedure(s) Performed: OPEN REDUCTION INTERNAL FIXATION (ORIF) RIGHT DISTAL RADIAL FRACTURE (Right)  Patient Location: PACU  Anesthesia Type:MAC combined with regional for post-op pain  Level of Consciousness: awake and alert   Airway & Oxygen Therapy: Patient Spontanous Breathing and Patient connected to face mask oxygen  Post-op Assessment: Report given to RN and Post -op Vital signs reviewed and stable  Post vital signs: Reviewed and stable  Last Vitals:  Vitals Value Taken Time  BP 94/60 07/15/21 1620  Temp    Pulse 56 07/15/21 1621  Resp 19 07/15/21 1621  SpO2 99 % 07/15/21 1621  Vitals shown include unvalidated device data.  Last Pain:  Vitals:   07/15/21 1307  TempSrc: Oral  PainSc: 0-No pain      Patients Stated Pain Goal: 8 (93/71/69 6789)  Complications: No notable events documented.

## 2021-07-15 NOTE — Anesthesia Preprocedure Evaluation (Addendum)
Anesthesia Evaluation  Patient identified by MRN, date of birth, ID band Patient awake    Reviewed: Allergy & Precautions, NPO status , Patient's Chart, lab work & pertinent test results  Airway Mallampati: II  TM Distance: >3 FB Neck ROM: Full    Dental no notable dental hx.    Pulmonary neg pulmonary ROS,    Pulmonary exam normal breath sounds clear to auscultation       Cardiovascular negative cardio ROS Normal cardiovascular exam Rhythm:Regular Rate:Normal     Neuro/Psych negative neurological ROS  negative psych ROS   GI/Hepatic Neg liver ROS, GERD  Controlled,  Endo/Other  negative endocrine ROS  Renal/GU negative Renal ROS  negative genitourinary   Musculoskeletal negative musculoskeletal ROS (+)   Abdominal   Peds negative pediatric ROS (+)  Hematology negative hematology ROS (+)   Anesthesia Other Findings   Reproductive/Obstetrics negative OB ROS                            Anesthesia Physical Anesthesia Plan  ASA: 2  Anesthesia Plan: MAC   Post-op Pain Management:    Induction: Intravenous  PONV Risk Score and Plan: 2 and Propofol infusion, TIVA and Treatment may vary due to age or medical condition  Airway Management Planned: Simple Face Mask and Natural Airway  Additional Equipment:   Intra-op Plan:   Post-operative Plan:   Informed Consent: I have reviewed the patients History and Physical, chart, labs and discussed the procedure including the risks, benefits and alternatives for the proposed anesthesia with the patient or authorized representative who has indicated his/her understanding and acceptance.     Dental advisory given  Plan Discussed with: CRNA and Anesthesiologist  Anesthesia Plan Comments:         Anesthesia Quick Evaluation

## 2021-07-15 NOTE — Anesthesia Procedure Notes (Addendum)
Anesthesia Regional Block: Supraclavicular block   Pre-Anesthetic Checklist: , timeout performed,  Correct Patient, Correct Site, Correct Laterality,  Correct Procedure, Correct Position, site marked,  Risks and benefits discussed,  Surgical consent,  Pre-op evaluation,  At surgeon's request and post-op pain management  Laterality: Right  Prep: chloraprep       Needles:  Injection technique: Single-shot  Needle Type: Echogenic Stimulator Needle     Needle Length: 10cm  Needle Gauge: 20     Additional Needles:   Procedures:,,,, ultrasound used (permanent image in chart),,    Narrative:  Start time: 07/15/2021 1:30 PM End time: 07/15/2021 1:35 PM Injection made incrementally with aspirations every 5 mL.  Performed by: Personally  Anesthesiologist: Merlinda Frederick, MD  Additional Notes: Functioning IV was confirmed and monitors applied. Sterile prep and drape,hand hygiene and sterile gloves were used.Ultrasound guidance: relevant anatomy identified, needle position confirmed, local anesthetic spread visualized around nerve(s)., vascular puncture avoided.  Image printed for medical record.  Negative aspiration and negative test dose prior to incremental administration of local anesthetic. The patient tolerated the procedure well.

## 2021-07-15 NOTE — Op Note (Addendum)
07/15/2021  [facility]  Operative Note  Pre Op Diagnosis: Right comminuted intraarticular distal radius fracture  Post Op Diagnosis: Right comminuted intraarticular distal radius fracture  Procedure:  ORIF Right comminuted intraarticular distal radius fracture, 2 intraarticular fragments Right brachioradialis release  Surgeon: Leanora Cover, MD  Assistant: Daryll Brod, MD  Anesthesia: Regional with sedation  Fluids: Per anesthesia flow sheet  EBL: minimal  Complications: None  Specimen: None  Tourniquet Time:  Total Tourniquet Time Documented: Upper Arm (Right) - 49 minutes Total: Upper Arm (Right) - 49 minutes  Disposition: Stable to PACU  INDICATIONS:  Caitlin Herman is a 60 y.o. female who states she injured right wrist roller skating approximately 2 weeks ago.  Seen at [facility] where XR revealed a distal radius fracture.  Splinted and followed up in office.  We discussed nonoperative and operative treatment options.  She wished to proceed with operative fixation.  Risks, benefits, and alternatives of surgery were discussed including the risk of blood loss; infection; damage to nerves, vessels, tendons, ligaments, bone; failure of surgery; need for additional surgery; complications with wound healing; continued pain; nonunion; malunion; stiffness.  We also discussed the possible need for bone graft and the benefits and risks including the possibility of disease transmission.  She voiced understanding of these risks and elected to proceed.    OPERATIVE COURSE:  After being identified preoperatively by myself, the patient and I agreed upon the procedure and site of procedure.  Surgical site was marked.   Surgical consent had been signed.  She was given IV Ancef as preoperative antibiotic prophylaxis.  She was transferred to the operating room and placed on the operating room table in supine position with the Right upper extremity on an armboard. Sedation was induced by  the anesthesiologist.  A regional block had been performed by anesthesia in preoperative holding.  The Right upper extremity was prepped and draped in normal sterile orthopedic fashion.  A surgical pause was performed between the surgeons, anesthesia and operating room staff, and all were in agreement as to the patient, procedure and site of procedure.  Tourniquet at the proximal aspect of the extremity was inflated to 250 mmHg after exsanguination of the limb with an Esmarch bandage.  Standard volar Mallie Mussel approach was used.  The bipolar electrocautery was used to obtain hemostasis.  The superficial and deep portions of the FCR tendon sheath were incised, and the FCR and FPL were swept ulnarly to protect the palmar cutaneous branch of the median nerve.  The brachioradialis was released at the radial side of the radius.  The pronator quadratus was released and elevated with the periosteal elevator.  The fracture site was identified and cleared of soft tissue interposition and hematoma.  It was reduced under direct visualization.  There was intraarticular extension creating two intraarticular fragments.  There was a dorsal intraarticular fragment.  An AcuMed volar distal radial locking plate was selected.  It was secured to the bone with the guidepins.  C-arm was used in AP and lateral projections to ensure appropriate reduction and position of the hardware and adjustments made as necessary.  Standard AO drilling and measuring technique was used.  A single screw was placed in the slotted hole in the shaft of the plate.  The distal holes were filled with locking pegs with the exception of the styloid holes, which were filled with locking screws.  The remaining holes in the shaft of the plate were filled with nonlocking screws.  Good purchase  was obtained.  C-arm was used in AP, lateral and oblique projections to ensure appropriate reduction and position of hardware, which was the case.  There was no intra-articular  penetration of hardware.  The wound was copiously irrigated with sterile saline.  Pronator quadratus was repaired back over top of the plate using 4-0 Vicryl suture.  Vicryl suture was placed in the subcutaneous tissues in an inverted interrupted fashion and the skin was closed with 4-0 nylon in a horizontal mattress fashion.  There was good pronation and supination of the wrist without crepitance.  The wound was then dressed with sterile Xeroform, 4x4s, and wrapped with a Kerlix bandage.  A volar splint was placed and wrapped with Kerlix and Ace bandage.  Tourniquet was deflated at 49 minutes.  Fingertips were pink with brisk capillary refill after deflation of the tourniquet.  Operative drapes were broken down.  The patient was awoken from anesthesia safely.  She was transferred back to the stretcher and taken to the PACU in stable condition.  I will see her back in the office in one week for postoperative followup.  I will give her a prescription for Norco 5/325 1-2 tabs PO q6 hours prn pain, dispense # 25.    Leanora Cover, MD Electronically signed, 07/15/21

## 2021-07-15 NOTE — Progress Notes (Signed)
Assisted Dr. Elgie Congo with right, ultrasound guided, supraclavicular block. Side rails up, monitors on throughout procedure. See vital signs in flow sheet. Tolerated Procedure well.

## 2021-07-15 NOTE — Discharge Instructions (Addendum)

## 2021-07-15 NOTE — H&P (Signed)
Caitlin Herman is an 59 y.o. female.   Chief Complaint: wrist fracture HPI: 60 yo female states she fell while rollerskating 06/26/21 injuring right wrist.  [facility] right distal radius fracture.  Splinted and followed up in office.  She wishes to proceed with operative reduction and fixation.  Allergies: No Known Allergies  Past Medical History:  Diagnosis Date   Basal cell carcinoma    chest   Closed fracture of right distal radius    GERD (gastroesophageal reflux disease)    takes OTC when needed    Past Surgical History:  Procedure Laterality Date   COLONOSCOPY      Family History: History reviewed. No pertinent family history.  Social History:   reports that she has never smoked. She has never used smokeless tobacco. She reports current alcohol use. She reports that she does not use drugs.  Medications: No medications prior to admission.    No results found for this or any previous visit (from the past 48 hour(s)).  No results found.   A comprehensive review of systems was negative.  Height '5\' 3"'$  (1.6 m), weight 70.3 kg.  General appearance: alert, cooperative, and appears stated age Head: Normocephalic, without obvious abnormality, atraumatic Neck: supple, symmetrical, trachea midline Cardio: regular rate and rhythm Resp: clear to auscultation bilaterally Extremities: Intact sensation and capillary refill all digits.  +epl/fpl/io.  No wounds.  Pulses: 2+ and symmetric Skin: Skin color, texture, turgor normal. No rashes or lesions Neurologic: Grossly normal Incision/Wound: none  Assessment/Plan Right distal radius fracture.  Non operative and operative treatment options have been discussed with the patient and patient wishes to proceed with operative treatment. Risks, benefits, and alternatives of surgery have been discussed and the patient agrees with the plan of care.   Leanora Cover 07/15/2021, 8:45 AM

## 2021-07-16 ENCOUNTER — Encounter (HOSPITAL_BASED_OUTPATIENT_CLINIC_OR_DEPARTMENT_OTHER): Payer: Self-pay | Admitting: Orthopedic Surgery

## 2021-07-19 NOTE — Anesthesia Postprocedure Evaluation (Signed)
Anesthesia Post Note  Patient: BRITT THEARD  Procedure(s) Performed: OPEN REDUCTION INTERNAL FIXATION (ORIF) RIGHT DISTAL RADIAL FRACTURE (Right: Wrist)     Patient location during evaluation: PACU Anesthesia Type: MAC and Regional Level of consciousness: awake and alert Pain management: pain level controlled Vital Signs Assessment: post-procedure vital signs reviewed and stable Respiratory status: spontaneous breathing and respiratory function stable Cardiovascular status: stable Postop Assessment: no apparent nausea or vomiting Anesthetic complications: no   No notable events documented.  Last Vitals:  Vitals:   07/15/21 1645 07/15/21 1652  BP:  112/65  Pulse:  (!) 56  Resp:  16  Temp:  (!) 36.4 C  SpO2: 96% 94%    Last Pain:  Vitals:   07/15/21 1645  TempSrc:   PainSc: 0-No pain                 Merlinda Frederick

## 2022-10-20 ENCOUNTER — Other Ambulatory Visit: Payer: Self-pay | Admitting: Family Medicine

## 2022-10-20 ENCOUNTER — Other Ambulatory Visit (HOSPITAL_COMMUNITY)
Admission: RE | Admit: 2022-10-20 | Discharge: 2022-10-20 | Disposition: A | Discharge status: Home / Self Care | Payer: No Typology Code available for payment source | Source: Ambulatory Visit | Attending: Family Medicine | Admitting: Family Medicine

## 2022-10-20 DIAGNOSIS — Z01411 Encounter for gynecological examination (general) (routine) with abnormal findings: Secondary | ICD-10-CM | POA: Insufficient documentation

## 2022-10-26 LAB — CYTOLOGY - PAP
Comment: NEGATIVE
Diagnosis: NEGATIVE
High risk HPV: NEGATIVE
